# Patient Record
Sex: Male | Born: 1960 | Race: Black or African American | Hispanic: No | Marital: Single | Smoking: Never smoker
Health system: Southern US, Community
[De-identification: ages and names within clinical notes are randomized; demographics above are authoritative.]

## PROBLEM LIST (undated history)

## (undated) DIAGNOSIS — B009 Herpesviral infection, unspecified: Secondary | ICD-10-CM

## (undated) DIAGNOSIS — I10 Essential (primary) hypertension: Secondary | ICD-10-CM

---

## 2005-11-17 ENCOUNTER — Emergency Department: Payer: Self-pay | Admitting: Emergency Medicine

## 2006-02-18 ENCOUNTER — Emergency Department: Payer: Self-pay | Admitting: Unknown Physician Specialty

## 2007-02-28 ENCOUNTER — Emergency Department: Payer: Self-pay | Admitting: Emergency Medicine

## 2007-03-08 ENCOUNTER — Emergency Department: Payer: Self-pay | Admitting: Emergency Medicine

## 2011-11-12 ENCOUNTER — Emergency Department: Payer: Self-pay | Admitting: Emergency Medicine

## 2013-03-22 ENCOUNTER — Emergency Department: Payer: Self-pay | Admitting: Emergency Medicine

## 2013-03-24 ENCOUNTER — Emergency Department: Payer: Self-pay | Admitting: Emergency Medicine

## 2013-04-03 ENCOUNTER — Emergency Department: Payer: Self-pay | Admitting: Emergency Medicine

## 2013-04-04 ENCOUNTER — Emergency Department: Payer: Self-pay | Admitting: Emergency Medicine
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
## 2015-03-31 ENCOUNTER — Emergency Department
Admission: EM | Admit: 2015-03-31 | Discharge: 2015-03-31 | Disposition: A | Discharge status: Home / Self Care | Payer: Self-pay | Attending: Emergency Medicine | Admitting: Emergency Medicine

## 2015-03-31 ENCOUNTER — Encounter: Payer: Self-pay | Admitting: Emergency Medicine

## 2015-03-31 DIAGNOSIS — N471 Phimosis: Secondary | ICD-10-CM | POA: Insufficient documentation

## 2015-03-31 HISTORY — DX: Herpesviral infection, unspecified: B00.9

## 2015-03-31 LAB — CHLAMYDIA/NGC RT PCR (ARMC ONLY)
CHLAMYDIA TR: NOT DETECTED
N gonorrhoeae: NOT DETECTED

## 2015-03-31 LAB — URINALYSIS COMPLETE WITH MICROSCOPIC (ARMC ONLY)
Bilirubin Urine: NEGATIVE
GLUCOSE, UA: NEGATIVE mg/dL
Ketones, ur: NEGATIVE mg/dL
Nitrite: NEGATIVE
PROTEIN: 30 mg/dL — AB
Specific Gravity, Urine: 1.025 (ref 1.005–1.030)
pH: 5 (ref 5.0–8.0)

## 2015-03-31 MED ORDER — OXYCODONE-ACETAMINOPHEN 5-325 MG PO TABS: 1.0000 | ORAL_TABLET | ORAL | Status: AC | PRN

## 2015-03-31 MED ORDER — CLINDAMYCIN HCL 300 MG PO CAPS: 300.0000 mg | ORAL_CAPSULE | Freq: Three times a day (TID) | ORAL | Status: AC

## 2015-03-31 NOTE — ED Provider Notes (Signed)
Patient seen and examined by me, has extensive phimosis. We will place Foley catheter to ensure he does not have any urinary obstruction. He also be placed on oral antibiotics and he will close follow-up with urology.Medical screening examination/treatment/procedure(s) were performed by non-physician practitioner and as supervising physician I was immediately available for consultation/collaboration.    Emily FilbertJonathan E Taelynn Mcelhannon, MD 03/31/15 [phone]

## 2015-03-31 NOTE — ED Notes (Signed)
Pt states he has a lesion on his penis x 2 weeks, states he thinks area is becoming infected, hx of genital herpes

## 2015-03-31 NOTE — ED Provider Notes (Signed)
[facility] Emergency Department Provider Note  ____________________________________________  Time seen: Approximately 2:32 PM  I have reviewed the triage vital signs and the nursing notes.   HISTORY  Chief Complaint Penis Pain    HPI Rodney Solis is a 54 y.o. male the past medical history of genital herpes reporting lesion head of his penis for 2 weeks. However recently he is unable to retract his foreskin in his entire penis shaft and head is all swollen. Rates pain is 6/10. Denies dysuria.   Past Medical History  Diagnosis Date  . HSV-2 (herpes simplex virus 2) infection     There are no active problems to display for this patient.   History reviewed. No pertinent past surgical history.  Current Outpatient Rx  Name  Route  Sig  Dispense  Refill  . clindamycin (CLEOCIN) 300 MG capsule   Oral   Take 1 capsule (300 mg total) by mouth 3 (three) times daily.   30 capsule   0   . oxyCODONE-acetaminophen (ROXICET) 5-325 MG tablet   Oral   Take 1-2 tablets by mouth every 4 (four) hours as needed for severe pain.   20 tablet   0     Allergies Review of patient's allergies indicates no known allergies.  History reviewed. No pertinent family history.  Social History Social History  Substance Use Topics  . Smoking status: Never Smoker   . Smokeless tobacco: None  . Alcohol Use: No    Review of Systems Constitutional: No fever/chills Eyes: No visual changes. ENT: No sore throat. Cardiovascular: Denies chest pain. Respiratory: Denies shortness of breath. Gastrointestinal: No abdominal pain.  No nausea, no vomiting.  No diarrhea.  No constipation. Genitourinary: Negative for dysuria. Positive for lesion on penis. Musculoskeletal: Negative for back pain. Skin: Negative for rash. Neurological: Negative for headaches, focal weakness or numbness.  10-point ROS otherwise  negative.  ____________________________________________   PHYSICAL EXAM:  VITAL SIGNS: ED Triage Vitals  Enc Vitals Group     BP 03/31/15 1424 154/87 mmHg     Pulse Rate 03/31/15 1424 77     Resp 03/31/15 1424 18     Temp 03/31/15 1424 98.2 F (36.8 C)     Temp Source 03/31/15 1424 Oral     SpO2 03/31/15 1424 100 %     Weight 03/31/15 1424 196 lb (88.905 kg)     Height 03/31/15 1424  (1.803 m)     Head Cir --      Peak Flow --      Pain Score 03/31/15 1425 6     Pain Loc --      Pain Edu? --      Excl. in GC? --     Constitutional: Alert and oriented. Well appearing and in no acute distress. Cardiovascular: Normal rate, regular rhythm. Grossly normal heart sounds.  Good peripheral circulation. Respiratory: Normal respiratory effort.  No retractions. Lungs CTAB. Gastrointestinal: Soft and nontender. No distention. No abdominal bruits. No CVA tenderness. Genitourinary: Penile shaft and head uncircumcised male extremely swollen tender erythematous. No obvious drainage or lesions. Musculoskeletal: No lower extremity tenderness nor edema.  No joint effusions. Neurologic:  Normal speech and language. No gross focal neurologic deficits are appreciated. No gait instability. Skin:  Skin is warm, dry and intact. No rash noted. Psychiatric: Mood and affect are normal. Speech and behavior are normal.  ____________________________________________   LABS (all labs ordered are listed, but only abnormal results are displayed)  Labs  Reviewed  CHLAMYDIA/NGC RT PCR ([facility] ONLY)  URINALYSIS COMPLETEWITH MICROSCOPIC ([facility] ONLY)   ____________________________________________    PROCEDURES  Procedure(s) performed: None  Critical Care performed: No  ____________________________________________   INITIAL IMPRESSION / ASSESSMENT AND PLAN / ED COURSE  Pertinent labs & imaging results that were available during my care of the patient were reviewed by me and considered  in my medical decision making (see chart for details).  Acute balanitis. For catheter inserted. Patient to retain and remove in 48 hours with urologist or return to the ER as needed. Patient started on clindamycin 3 times a day. GC chlamydia pending at this time. Patient voices no other emergency medical complaints at this time. ____________________________________________   FINAL CLINICAL IMPRESSION(S) / ED DIAGNOSES  Final diagnoses:  Phimosis      Evangeline DakinCharles M Gianny Sabino, PA-C 03/31/15 1548  Darien Ramusavid W Kaminski, MD 03/31/15 [phone]

## 2015-04-02 ENCOUNTER — Encounter: Payer: Self-pay | Admitting: Emergency Medicine

## 2015-04-02 ENCOUNTER — Emergency Department
Admission: EM | Admit: 2015-04-02 | Discharge: 2015-04-02 | Disposition: A | Discharge status: Home / Self Care | Payer: Self-pay | Attending: Emergency Medicine | Admitting: Emergency Medicine

## 2015-04-02 DIAGNOSIS — Z792 Long term (current) use of antibiotics: Secondary | ICD-10-CM | POA: Insufficient documentation

## 2015-04-02 DIAGNOSIS — Z466 Encounter for fitting and adjustment of urinary device: Secondary | ICD-10-CM | POA: Insufficient documentation

## 2015-04-02 NOTE — ED Provider Notes (Signed)
[facility] Emergency Department Provider Note  ____________________________________________  Time seen: Approximately 10:54 AM  I have reviewed the triage vital signs and the nursing notes.   HISTORY  Chief Complaint Male GU Problem    HPI Rodney Solis is a 54 y.o. male patient here for Foley catheter removal. Patient was seen 2 days ago and Foley catheter was inserted secondary to phimosis. Patient recently had an outbreak of herpes simplex 2 which became infected in his penial area. Patient stated this been remarkable reduction and edemastatus post antibiotics and restarting Valtrex. Patient is rating his pain from the Foley as 8/10.   Past Medical History  Diagnosis Date  . HSV-2 (herpes simplex virus 2) infection     There are no active problems to display for this patient.   History reviewed. No pertinent past surgical history.  Current Outpatient Rx  Name  Route  Sig  Dispense  Refill  . clindamycin (CLEOCIN) 300 MG capsule   Oral   Take 1 capsule (300 mg total) by mouth 3 (three) times daily.   30 capsule   0   . oxyCODONE-acetaminophen (ROXICET) 5-325 MG tablet   Oral   Take 1-2 tablets by mouth every 4 (four) hours as needed for severe pain.   20 tablet   0     Allergies Review of patient's allergies indicates no known allergies.  No family history on file.  Social History Social History  Substance Use Topics  . Smoking status: Never Smoker   . Smokeless tobacco: None  . Alcohol Use: No    Review of Systems Constitutional: No fever/chills Eyes: No visual changes. ENT: No sore throat. Cardiovascular: Denies chest pain. Respiratory: Denies shortness of breath. Gastrointestinal: No abdominal pain.  No nausea, no vomiting.  No diarrhea.  No constipation. Genitourinary: Negative for dysuria. Swollen penis gland. Musculoskeletal: Negative for back pain. Skin: Negative for rash. Neurological: Negative for headaches,  focal weakness or numbness.  10-point ROS otherwise negative.  ____________________________________________   PHYSICAL EXAM:  VITAL SIGNS: ED Triage Vitals  Enc Vitals Group     BP 04/02/15 1021 135/101 mmHg     Pulse Rate 04/02/15 1021 80     Resp 04/02/15 1021 18     Temp 04/02/15 1021 97.8 F (36.6 C)     Temp src --      SpO2 04/02/15 1021 98 %     Weight 04/02/15 1021 198 lb (89.812 kg)     Height 04/02/15 1021 6' (1.829 m)     Head Cir --      Peak Flow --      Pain Score 04/02/15 1025 8     Pain Loc --      Pain Edu? --      Excl. in GC? --    Constitutional: Alert and oriented. Well appearing and in no acute distress. Eyes: Conjunctivae are normal. PERRL. EOMI. Head: Atraumatic. Nose: No congestion/rhinnorhea. Mouth/Throat: Mucous membranes are moist.  Oropharynx non-erythematous. Neck: No stridor.  No cervical spine tenderness to palpation. Hematological/Lymphatic/Immunilogical: No cervical lymphadenopathy. Cardiovascular: Normal rate, regular rhythm. Grossly normal heart sounds.  Good peripheral circulation. Respiratory: Normal respiratory effort.  No retractions. Lungs CTAB. Gastrointestinal: Soft and nontender. No distention. No abdominal bruits. No CVA tenderness. Genitourinary: Edematous penis with resolving vesicle lesions. Foley catheter and place Musculoskeletal: No lower extremity tenderness nor edema.  No joint effusions. Neurologic:  Normal speech and language. No gross focal neurologic deficits are appreciated. No gait  instability. Skin:  Skin is warm, dry and intact. No rash noted vesicle lesions on penis Psychiatric: Mood and affect are normal. Speech and behavior are normal.  ____________________________________________   LABS (all labs ordered are listed, but only abnormal results are displayed)  Labs Reviewed - No data to  display ____________________________________________  EKG   ____________________________________________  RADIOLOGY   ____________________________________________   PROCEDURES  Procedure(s) performed: None  Critical Care performed: No  ____________________________________________   INITIAL IMPRESSION / ASSESSMENT AND PLAN / ED COURSE  Pertinent labs & imaging results that were available during my care of the patient were reviewed by me and considered in my medical decision making (see chart for details).  Resolving Phimosis. Foley removed and patient advised return back to ER if any signs symptoms are urinary retention. Patient advised to follow-up with treatment orthopedics this week. ____________________________________________   FINAL CLINICAL IMPRESSION(S) / ED DIAGNOSES  Final diagnoses:  Encounter for Foley catheter removal      Joni ReiningRonald K Jessic Standifer, PA-C 04/02/15 1104  Jene Everyobert Kinner, MD 04/02/15 1501

## 2015-04-02 NOTE — Discharge Instructions (Signed)
Return back to ER for urinary retention occurs.

## 2015-04-02 NOTE — ED Notes (Signed)
Patient presents to the ED with a urinary catheter that was placed on Saturday.  Patient was told he would need that catheter for two days.  Patient states, "catheter is very, very, very, very uncomfortable."  Patient states catheter is draining fine.  Catheter was put in place to assist in the healing of an STI lesion.  Patient states area is getting better.

## 2015-04-02 NOTE — ED Notes (Signed)
Assessed per PA 

## 2015-06-14 ENCOUNTER — Ambulatory Visit: Payer: Self-pay | Admitting: Ophthalmology

## 2016-06-12 ENCOUNTER — Ambulatory Visit: Payer: Self-pay | Admitting: Ophthalmology

## 2016-06-26 ENCOUNTER — Ambulatory Visit: Payer: Self-pay | Admitting: Ophthalmology

## 2017-12-22 ENCOUNTER — Encounter: Payer: Self-pay | Admitting: Emergency Medicine

## 2017-12-22 ENCOUNTER — Other Ambulatory Visit: Payer: Self-pay

## 2017-12-22 ENCOUNTER — Emergency Department
Admission: EM | Admit: 2017-12-22 | Discharge: 2017-12-22 | Disposition: A | Discharge status: Home / Self Care | Payer: Self-pay | Attending: Emergency Medicine | Admitting: Emergency Medicine

## 2017-12-22 DIAGNOSIS — Y998 Other external cause status: Secondary | ICD-10-CM | POA: Insufficient documentation

## 2017-12-22 DIAGNOSIS — Z79899 Other long term (current) drug therapy: Secondary | ICD-10-CM | POA: Insufficient documentation

## 2017-12-22 DIAGNOSIS — L089 Local infection of the skin and subcutaneous tissue, unspecified: Secondary | ICD-10-CM | POA: Insufficient documentation

## 2017-12-22 DIAGNOSIS — Y929 Unspecified place or not applicable: Secondary | ICD-10-CM | POA: Insufficient documentation

## 2017-12-22 DIAGNOSIS — Y939 Activity, unspecified: Secondary | ICD-10-CM | POA: Insufficient documentation

## 2017-12-22 DIAGNOSIS — S80862A Insect bite (nonvenomous), left lower leg, initial encounter: Secondary | ICD-10-CM | POA: Insufficient documentation

## 2017-12-22 DIAGNOSIS — W57XXXA Bitten or stung by nonvenomous insect and other nonvenomous arthropods, initial encounter: Secondary | ICD-10-CM | POA: Insufficient documentation

## 2017-12-22 MED ORDER — SULFAMETHOXAZOLE-TRIMETHOPRIM 800-160 MG PO TABS: 1.0000 | ORAL_TABLET | Freq: Two times a day (BID) | ORAL | 0 refills | Status: AC

## 2017-12-22 MED ORDER — HYDROXYZINE HCL 50 MG PO TABS: 50.0000 mg | ORAL_TABLET | Freq: Three times a day (TID) | ORAL | 0 refills | Status: AC | PRN

## 2017-12-22 NOTE — ED Provider Notes (Signed)
[facility] Emergency Department Provider Note   ____________________________________________   First MD Initiated Contact with Patient 12/22/17 1346     (approximate)  I have reviewed the triage vital signs and the nursing notes.   HISTORY  Chief Complaint Insect Bite   HPI Rodney Solis is a 57 y.o. male Patient presents with insect bite to the left lower extremity.  Patient incident occurred 2 days ago.  Patient notes increased redness and swelling.  Patient denies  drainage.  Patient rates pain as a 9/10.  Patient described pain is "aching".  No palates this measures for complaint.    Past Medical History:  Diagnosis Date  . HSV-2 (herpes simplex virus 2) infection     There are no active problems to display for this patient.   History reviewed. No pertinent surgical history.  Prior to Admission medications   Medication Sig Start Date End Date Taking? Authorizing Provider  amLODipine (NORVASC) 10 MG tablet Take 10 mg by mouth daily.   Yes [provider]  atorvastatin (LIPITOR) 10 MG tablet Take 10 mg by mouth daily.   Yes [provider]  elvitegravir-cobicistat-emtricitabine-tenofovir (GENVOYA) 150-150-200-10 MG TABS tablet Take 1 tablet by mouth daily with breakfast.   Yes [provider]  hydrochlorothiazide (MICROZIDE) 12.5 MG capsule Take 12.5 mg by mouth daily.   Yes [provider]  lisinopril (PRINIVIL,ZESTRIL) 10 MG tablet Take 10 mg by mouth daily.   Yes [provider]  clindamycin (CLEOCIN) 300 MG capsule Take 1 capsule (300 mg total) by mouth 3 (three) times daily. 03/31/15   Beers, Charmayne Sheerharles M, PA-C  hydrOXYzine (ATARAX/VISTARIL) 50 MG tablet Take 1 tablet (50 mg total) by mouth 3 (three) times daily as needed for itching. 12/22/17   Joni ReiningSmith, Simpson Paulos K, PA-C  oxyCODONE-acetaminophen (ROXICET) 5-325 MG tablet Take 1-2 tablets by mouth every 4 (four) hours as needed for severe pain. 03/31/15    Beers, Charmayne Sheerharles M, PA-C  sulfamethoxazole-trimethoprim (BACTRIM DS,SEPTRA DS) 800-160 MG tablet Take 1 tablet by mouth 2 (two) times daily. 12/22/17   Joni ReiningSmith, Nanetta Wiegman K, PA-C    Allergies Patient has no known allergies.  No family history on file.  Social History Social History   Tobacco Use  . Smoking status: Never Smoker  . Smokeless tobacco: Never Used  Substance Use Topics  . Alcohol use: No  . Drug use: Not on file    Review of Systems Constitutional: No fever/chills Eyes: No visual changes. ENT: No sore throat. Cardiovascular: Denies chest pain. Respiratory: Denies shortness of breath. Gastrointestinal: No abdominal pain.  No nausea, no vomiting.  No diarrhea.  No constipation. Genitourinary: Negative for dysuria. Musculoskeletal: Negative for back pain. Skin: Redness and swollen left lower leg. Neurological: Negative for headaches, focal weakness or numbness.   ____________________________________________   PHYSICAL EXAM:  VITAL SIGNS: ED Triage Vitals  Enc Vitals Group     BP 12/22/17 1332 139/90     Pulse Rate 12/22/17 1332 70     Resp 12/22/17 1334 16     Temp 12/22/17 1332 98.4 F (36.9 C)     Temp Source 12/22/17 1332 Oral     SpO2 12/22/17 1332 97 %     Weight 12/22/17 1332 192 lb (87.1 kg)     Height 12/22/17 1332 5\' 11"  (1.803 m)     Head Circumference --      Peak Flow --      Pain Score 12/22/17 1334 9  Pain Loc --      Pain Edu? --      Excl. in GC? --     Constitutional: Alert and oriented. Well appearing and in no acute distress. Cardiovascular: Normal rate, regular rhythm. Grossly normal heart sounds.  Good peripheral circulation. Respiratory: Normal respiratory effort.  No retractions. Lungs CTAB. Neurologic:  Normal speech and language. No gross focal neurologic deficits are appreciated. No gait instability. Skin:  Skin is warm, dry and intact.  Papular lesion on erythematous base left lower leg.   Psychiatric: Mood and affect are  normal. Speech and behavior are normal.  ____________________________________________   LABS (all labs ordered are listed, but only abnormal results are displayed)  Labs Reviewed - No data to display ____________________________________________  EKG   ____________________________________________  RADIOLOGY  ED MD interpretation:    Official radiology report(s): No results found.  ____________________________________________   PROCEDURES  Procedure(s) performed: None  Procedures  Critical Care performed: No  ____________________________________________   INITIAL IMPRESSION / ASSESSMENT AND PLAN / ED COURSE  As part of my medical decision making, I reviewed the following data within the electronic MEDICAL RECORD NUMBER    Wrist is swollen left lower leg secondary to infected insect bite.  Patient given discharge care instruction advised take medication as directed.  [facility]      ____________________________________________   FINAL CLINICAL IMPRESSION(S) / ED DIAGNOSES  Final diagnoses:  Bug bite with infection, initial encounter     ED Discharge Orders         Ordered    sulfamethoxazole-trimethoprim (BACTRIM DS,SEPTRA DS) 800-160 MG tablet  2 times daily     12/22/17 1441    hydrOXYzine (ATARAX/VISTARIL) 50 MG tablet  3 times daily PRN     12/22/17 1441           Note:  This document was prepared using Dragon voice recognition software and may include unintentional dictation errors.    Joni Reining, PA-C 12/22/17 1509    Emily Filbert, MD 12/22/17 [phone]

## 2017-12-22 NOTE — ED Triage Notes (Signed)
Pt to ED via POV with c/o possible insect bite to LFT lower extrem, redness noted. PT c/o pain , denies drainage or fever

## 2017-12-22 NOTE — Discharge Instructions (Addendum)
Take medication as directed until finished.

## 2018-05-05 DIAGNOSIS — I10 Essential (primary) hypertension: Secondary | ICD-10-CM | POA: Diagnosis not present

## 2018-05-05 DIAGNOSIS — Z21 Asymptomatic human immunodeficiency virus [HIV] infection status: Secondary | ICD-10-CM | POA: Diagnosis not present

## 2018-05-05 DIAGNOSIS — R509 Fever, unspecified: Secondary | ICD-10-CM | POA: Diagnosis not present

## 2018-05-05 DIAGNOSIS — B20 Human immunodeficiency virus [HIV] disease: Secondary | ICD-10-CM | POA: Diagnosis not present

## 2018-05-05 DIAGNOSIS — Z79899 Other long term (current) drug therapy: Secondary | ICD-10-CM | POA: Diagnosis not present

## 2018-05-05 DIAGNOSIS — R51 Headache: Secondary | ICD-10-CM | POA: Diagnosis not present

## 2018-08-26 DIAGNOSIS — B20 Human immunodeficiency virus [HIV] disease: Secondary | ICD-10-CM | POA: Diagnosis not present

## 2018-08-26 DIAGNOSIS — Z13220 Encounter for screening for lipoid disorders: Secondary | ICD-10-CM | POA: Diagnosis not present

## 2019-03-01 DIAGNOSIS — B20 Human immunodeficiency virus [HIV] disease: Secondary | ICD-10-CM | POA: Diagnosis not present

## 2019-03-01 DIAGNOSIS — F329 Major depressive disorder, single episode, unspecified: Secondary | ICD-10-CM | POA: Diagnosis not present

## 2019-03-01 DIAGNOSIS — I10 Essential (primary) hypertension: Secondary | ICD-10-CM | POA: Diagnosis not present

## 2019-03-01 DIAGNOSIS — E785 Hyperlipidemia, unspecified: Secondary | ICD-10-CM | POA: Diagnosis not present

## 2019-03-01 DIAGNOSIS — B009 Herpesviral infection, unspecified: Secondary | ICD-10-CM | POA: Diagnosis not present

## 2019-06-13 DIAGNOSIS — Z20822 Contact with and (suspected) exposure to covid-19: Secondary | ICD-10-CM | POA: Diagnosis not present

## 2019-06-17 DIAGNOSIS — Z23 Encounter for immunization: Secondary | ICD-10-CM | POA: Diagnosis not present

## 2019-06-17 DIAGNOSIS — R413 Other amnesia: Secondary | ICD-10-CM | POA: Diagnosis not present

## 2019-06-17 DIAGNOSIS — Z1211 Encounter for screening for malignant neoplasm of colon: Secondary | ICD-10-CM | POA: Diagnosis not present

## 2019-06-17 DIAGNOSIS — E785 Hyperlipidemia, unspecified: Secondary | ICD-10-CM | POA: Diagnosis not present

## 2019-06-17 DIAGNOSIS — B20 Human immunodeficiency virus [HIV] disease: Secondary | ICD-10-CM | POA: Diagnosis not present

## 2019-08-02 DIAGNOSIS — I10 Essential (primary) hypertension: Secondary | ICD-10-CM | POA: Diagnosis not present

## 2019-08-02 DIAGNOSIS — B20 Human immunodeficiency virus [HIV] disease: Secondary | ICD-10-CM | POA: Diagnosis not present

## 2019-08-02 DIAGNOSIS — E785 Hyperlipidemia, unspecified: Secondary | ICD-10-CM | POA: Diagnosis not present

## 2019-12-14 DIAGNOSIS — R21 Rash and other nonspecific skin eruption: Secondary | ICD-10-CM | POA: Diagnosis not present

## 2019-12-14 DIAGNOSIS — R062 Wheezing: Secondary | ICD-10-CM | POA: Diagnosis not present

## 2019-12-14 DIAGNOSIS — E785 Hyperlipidemia, unspecified: Secondary | ICD-10-CM | POA: Diagnosis not present

## 2019-12-14 DIAGNOSIS — I10 Essential (primary) hypertension: Secondary | ICD-10-CM | POA: Diagnosis not present

## 2019-12-14 DIAGNOSIS — Z23 Encounter for immunization: Secondary | ICD-10-CM | POA: Diagnosis not present

## 2019-12-14 DIAGNOSIS — B20 Human immunodeficiency virus [HIV] disease: Secondary | ICD-10-CM | POA: Diagnosis not present

## 2022-02-24 ENCOUNTER — Other Ambulatory Visit: Payer: Self-pay

## 2022-02-24 ENCOUNTER — Emergency Department
Admission: EM | Admit: 2022-02-24 | Discharge: 2022-02-24 | Disposition: A | Discharge status: Home / Self Care | Payer: Medicare HMO | Attending: Emergency Medicine | Admitting: Emergency Medicine

## 2022-02-24 DIAGNOSIS — Z23 Encounter for immunization: Secondary | ICD-10-CM | POA: Diagnosis not present

## 2022-02-24 DIAGNOSIS — S61211A Laceration without foreign body of left index finger without damage to nail, initial encounter: Secondary | ICD-10-CM | POA: Insufficient documentation

## 2022-02-24 DIAGNOSIS — W293XXA Contact with powered garden and outdoor hand tools and machinery, initial encounter: Secondary | ICD-10-CM | POA: Diagnosis not present

## 2022-02-24 DIAGNOSIS — S61412A Laceration without foreign body of left hand, initial encounter: Secondary | ICD-10-CM

## 2022-02-24 MED ORDER — TETANUS-DIPHTH-ACELL PERTUSSIS 5-2.5-18.5 LF-MCG/0.5 IM SUSY
0.5000 mL | PREFILLED_SYRINGE | Freq: Once | INTRAMUSCULAR | Status: AC
  Administered 2022-02-24: 0.5 mL via INTRAMUSCULAR
  Filled 2022-02-24: qty 0.5

## 2022-02-24 NOTE — ED Triage Notes (Signed)
Pt presents to the ED via POV due to finger lac that happened today. Pt states he went ot go pick up his hedge trim and accidentally turned it on. Pt appears with the bandage wrapped and bleeding under control. Pt states he had his tetanus in the last 5 years. Pt A&Ox4

## 2022-02-24 NOTE — ED Provider Notes (Signed)
[facility] Provider Note  Patient Contact: 4:24 PM (approximate)   History   Finger Injury   HPI  Rodney Solis is a 61 y.o. male to the emergency department with a 0.25 cm laceration along the volar aspect of the left index finger sustained accidentally with a hedge tremor.  Patient cannot recall his last tetanus shot.  No numbness or tingling in the left hand.     Physical Exam   Triage Vital Signs: ED Triage Vitals  Enc Vitals Group     BP 02/24/22 1437 (!) 153/97     Pulse Rate 02/24/22 1437 69     Resp 02/24/22 1437 16     Temp 02/24/22 1437 97.7 F (36.5 C)     Temp Source 02/24/22 1437 Oral     SpO2 02/24/22 1437 96 %     Weight 02/24/22 1440 202 lb (91.6 kg)     Height 02/24/22 1440 5\' 11"  (1.803 m)     Head Circumference --      Peak Flow --      Pain Score 02/24/22 1439 7     Pain Loc --      Pain Edu? --      Excl. in GC? --     Most recent vital signs: Vitals:   02/24/22 1437  BP: (!) 153/97  Pulse: 69  Resp: 16  Temp: 97.7 F (36.5 C)  SpO2: 96%     General: Alert and in no acute distress. Eyes:  PERRL. EOMI. Head: No acute traumatic findings ENT:      Nose: No congestion/rhinnorhea.      Mouth/Throat: Mucous membranes are moist. Neck: No stridor. No cervical spine tenderness to palpation. Cardiovascular:  Good peripheral perfusion Respiratory: Normal respiratory effort without tachypnea or retractions. Lungs CTAB. Good air entry to the bases with no decreased or absent breath sounds. Gastrointestinal: Bowel sounds 4 quadrants. Soft and nontender to palpation. No guarding or rigidity. No palpable masses. No distention. No CVA tenderness. Musculoskeletal: Full range of motion to all extremities.  Neurologic:  No gross focal neurologic deficits are appreciated.  Skin: Patient has a 0.25 cm laceration along the volar aspect of the left index finger. Other:   ED Results / Procedures / Treatments   Labs (all  labs ordered are listed, but only abnormal results are displayed) Labs Reviewed - No data to display    PROCEDURES:  Critical Care performed: No  ..Laceration Repair  Date/Time: 02/24/2022 4:26 PM  Performed by: 02/26/2022, PA-C Authorized by: Orvil Feil, PA-C   Consent:    Consent obtained:  Verbal   Risks discussed:  Infection and pain Universal protocol:    Procedure explained and questions answered to patient or proxy's satisfaction: yes     Patient identity confirmed:  Verbally with patient Anesthesia:    Anesthesia method:  None Laceration details:    Location:  Finger   Finger location:  L index finger   Length (cm):  0.3 Pre-procedure details:    Preparation:  Patient was prepped and draped in usual sterile fashion Exploration:    Limited defect created (wound extended): no     Wound exploration: wound explored through full range of motion     Contaminated: no   Treatment:    Area cleansed with:  Saline   Amount of cleaning:  Standard   Visualized foreign bodies/material removed: no     Debridement:  None Skin repair:  Repair method:  Tissue adhesive Approximation:    Approximation:  Close Repair type:    Repair type:  Simple Post-procedure details:    Dressing:  Adhesive bandage   Procedure completion:  Tolerated well, no immediate complications    MEDICATIONS ORDERED IN ED: Medications  Tdap (BOOSTRIX) injection 0.5 mL (has no administration in time range)     IMPRESSION / MDM / ASSESSMENT AND PLAN / ED COURSE  I reviewed the triage vital signs and the nursing notes.                              Assessment and plan Laceration 61 year old male presents to the emergency department with a 0.25 cm laceration along the left index finger.  Laceration was repaired without complication.  I updated patient's tetanus prior to discharge.  All patient questions were answered.      FINAL CLINICAL IMPRESSION(S) / ED DIAGNOSES   Final  diagnoses:  Laceration of left hand without foreign body, initial encounter     Rx / DC Orders   ED Discharge Orders     None        Note:  This document was prepared using Dragon voice recognition software and may include unintentional dictation errors.   Vallarie Mare [place], PA-C 02/24/22 1628    Lavonia Drafts, MD 02/24/22 [phone]

## 2023-08-24 ENCOUNTER — Emergency Department
Admission: EM | Admit: 2023-08-24 | Discharge: 2023-08-24 | Disposition: A | Discharge status: Home / Self Care | Attending: Emergency Medicine | Admitting: Emergency Medicine

## 2023-08-24 ENCOUNTER — Emergency Department

## 2023-08-24 ENCOUNTER — Encounter: Payer: Self-pay | Admitting: Intensive Care

## 2023-08-24 ENCOUNTER — Other Ambulatory Visit: Payer: Self-pay

## 2023-08-24 DIAGNOSIS — S161XXA Strain of muscle, fascia and tendon at neck level, initial encounter: Secondary | ICD-10-CM | POA: Diagnosis not present

## 2023-08-24 DIAGNOSIS — S39012A Strain of muscle, fascia and tendon of lower back, initial encounter: Secondary | ICD-10-CM

## 2023-08-24 DIAGNOSIS — Y92410 Unspecified street and highway as the place of occurrence of the external cause: Secondary | ICD-10-CM | POA: Diagnosis not present

## 2023-08-24 DIAGNOSIS — S0990XA Unspecified injury of head, initial encounter: Secondary | ICD-10-CM | POA: Insufficient documentation

## 2023-08-24 DIAGNOSIS — S199XXA Unspecified injury of neck, initial encounter: Secondary | ICD-10-CM | POA: Diagnosis present

## 2023-08-24 HISTORY — DX: Essential (primary) hypertension: I10

## 2023-08-24 MED ORDER — NAPROXEN 500 MG PO TABS: 500.0000 mg | ORAL_TABLET | Freq: Two times a day (BID) | ORAL | 2 refills | Status: AC

## 2023-08-24 MED ORDER — KETOROLAC TROMETHAMINE 30 MG/ML IJ SOLN
30.0000 mg | Freq: Once | INTRAMUSCULAR | Status: AC
  Administered 2023-08-24: 30 mg via INTRAMUSCULAR
  Filled 2023-08-24: qty 1

## 2023-08-24 MED ORDER — METHOCARBAMOL 500 MG PO TABS: 500.0000 mg | ORAL_TABLET | Freq: Three times a day (TID) | ORAL | 1 refills | Status: AC | PRN
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
## 2023-08-24 NOTE — ED Notes (Signed)
 Patient was c/o his left eye feeling gritty. Patient asked to have his eye flushed out. Patient's eye was flushed by this Paramedic.
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
